# Patient Record
Sex: Female | Born: 1945 | Race: White | Hispanic: No
Health system: Southern US, Community
[De-identification: ages and names within clinical notes are randomized; demographics above are authoritative.]

---

## 2013-09-11 ENCOUNTER — Emergency Department (HOSPITAL_COMMUNITY)
Admission: EM | Admit: 2013-09-11 | Discharge: 2013-09-11 | Disposition: A | Discharge status: Home / Self Care | Payer: Medicaid Other | Source: Home / Self Care | Attending: Family Medicine | Admitting: Family Medicine

## 2013-09-11 ENCOUNTER — Encounter (HOSPITAL_COMMUNITY): Payer: Self-pay | Admitting: Emergency Medicine

## 2013-09-11 DIAGNOSIS — Z760 Encounter for issue of repeat prescription: Secondary | ICD-10-CM

## 2013-09-11 MED ORDER — CITALOPRAM HYDROBROMIDE 40 MG PO TABS: 40.0000 mg | ORAL_TABLET | Freq: Every day | ORAL | Status: AC

## 2013-09-11 MED ORDER — ATORVASTATIN CALCIUM 10 MG PO TABS: 20.0000 mg | ORAL_TABLET | Freq: Every day | ORAL | Status: AC

## 2013-09-11 MED ORDER — CELECOXIB 200 MG PO CAPS: 200.0000 mg | ORAL_CAPSULE | Freq: Two times a day (BID) | ORAL | Status: AC

## 2013-09-11 MED ORDER — TRIAMTERENE-HCTZ 37.5-25 MG PO TABS: 1.0000 | ORAL_TABLET | Freq: Every day | ORAL | Status: AC

## 2013-09-11 MED ORDER — METOPROLOL SUCCINATE ER 25 MG PO TB24: 25.0000 mg | ORAL_TABLET | Freq: Every day | ORAL | Status: AC

## 2013-09-11 MED ORDER — LISINOPRIL 40 MG PO TABS: 40.0000 mg | ORAL_TABLET | Freq: Every day | ORAL | Status: AC

## 2013-09-11 NOTE — ED Notes (Signed)
C/o medication refill States she just moved in town on Oct.15, 2014  She just was approved for [employer] she has been out of medication for 20 days now

## 2013-09-11 NOTE — ED Provider Notes (Signed)
CSN: [id number]     Arrival date & time 09/11/13  1438 History   First MD Initiated Contact with Patient 09/11/13 1544     Chief Complaint  Patient presents with  . Medication Refill   (Consider location/radiation/quality/duration/timing/severity/associated sxs/prior Treatment) HPI Comments: Patient states she has recently relocated to [facility] from [place] and has not yet established with a PCP. Was recently approved for Medicaid and is here to request refills prescriptions for her daily medications.   The history is provided by the patient.    History reviewed. No pertinent past medical history. No past surgical history on file. No family history on file. History  Substance Use Topics  . Smoking status: Not on file  . Smokeless tobacco: Not on file  . Alcohol Use: Not on file   OB History   Grav Para Term Preterm Abortions TAB SAB Ect Mult Living                 Review of Systems  All other systems reviewed and are negative.    Allergies  Codeine  Home Medications   Current Outpatient Rx  Name  Route  Sig  Dispense  Refill  . atorvastatin (LIPITOR) 20 MG tablet   Oral   Take 20 mg by mouth daily.         . celecoxib (CELEBREX) 200 MG capsule   Oral   Take 200 mg by mouth 2 (two) times daily.         Marland Kitchen lisinopril (PRINIVIL,ZESTRIL) 40 MG tablet   Oral   Take 40 mg by mouth daily.         . metoprolol succinate (TOPROL-XL) 25 MG 24 hr tablet   Oral   Take 25 mg by mouth daily.         Marland Kitchen oxyCODONE-acetaminophen (PERCOCET) 10-325 MG per tablet   Oral   Take 1 tablet by mouth every 4 (four) hours as needed for pain.         Marland Kitchen triamterene-hydrochlorothiazide (DYAZIDE) 37.5-25 MG per capsule   Oral   Take 1 capsule by mouth daily.         Marland Kitchen zolpidem (AMBIEN) 10 MG tablet   Oral   Take 10 mg by mouth at bedtime as needed for sleep.         Marland Kitchen atorvastatin (LIPITOR) 10 MG tablet   Oral   Take 2 tablets (20 mg total) by mouth daily.   30 tablet  1   . celecoxib (CELEBREX) 200 MG capsule   Oral   Take 1 capsule (200 mg total) by mouth 2 (two) times daily.   60 capsule   0   . citalopram (CELEXA) 40 MG tablet   Oral   Take 1 tablet (40 mg total) by mouth daily.   30 tablet   0   . lisinopril (PRINIVIL,ZESTRIL) 40 MG tablet   Oral   Take 1 tablet (40 mg total) by mouth daily.   30 tablet   1   . metoprolol succinate (TOPROL XL) 25 MG 24 hr tablet   Oral   Take 1 tablet (25 mg total) by mouth daily.   30 tablet   1   . triamterene-hydrochlorothiazide (MAXZIDE-25) 37.5-25 MG per tablet   Oral   Take 1 tablet by mouth daily.   30 tablet   1    BP 146/68  Pulse 72  Temp(Src) 98 F (36.7 C) (Oral)  Resp 18  SpO2 100% Physical Exam  Nursing  note and vitals reviewed. Constitutional: She is oriented to person, place, and time. She appears well-developed and well-nourished. No distress.  +obese  HENT:  Head: Normocephalic and atraumatic.  Eyes: Conjunctivae are normal.  Cardiovascular: Normal rate.   Pulmonary/Chest: Effort normal.  Abdominal: Soft. There is no tenderness.  Musculoskeletal: Normal range of motion.  Neurological: She is alert and oriented to person, place, and time.  Skin: Skin is warm and dry. No rash noted.  Psychiatric: She has a normal mood and affect. Her behavior is normal.    ED Course  Procedures (including critical care time) Labs Review Labs Reviewed - No data to display Imaging Review No results found.  EKG Interpretation    Date/Time:    Ventricular Rate:    PR Interval:    QRS Duration:   QT Interval:    QTC Calculation:   R Axis:     Text Interpretation:              MDM  Provided patient with community resources to locate PCP and limited prescriptions for necessary daily medications.    Jess Barters [place], [place] 09/11/13 1655

## 2013-09-12 NOTE — ED Provider Notes (Signed)
Medical screening examination/treatment/procedure(s) were performed by resident physician or non-physician practitioner and as supervising physician I was immediately available for consultation/collaboration.   Barkley Bruns MD.   Linna Hoff, MD 09/12/13 [phone]

## 2013-10-21 ENCOUNTER — Other Ambulatory Visit: Payer: Self-pay | Admitting: Cardiovascular Disease

## 2013-10-21 ENCOUNTER — Ambulatory Visit
Admission: RE | Admit: 2013-10-21 | Discharge: 2013-10-21 | Disposition: A | Discharge status: Home / Self Care | Payer: Medicaid Other | Source: Ambulatory Visit | Attending: Cardiovascular Disease | Admitting: Cardiovascular Disease

## 2013-10-21 DIAGNOSIS — M25569 Pain in unspecified knee: Secondary | ICD-10-CM

## 2013-10-21 DIAGNOSIS — J45909 Unspecified asthma, uncomplicated: Secondary | ICD-10-CM

## 2014-11-04 IMAGING — CR DG KNEE 1-2V*R*
3 series · 3 of 3 positions shown · non-contrast
Comparison: None.

CLINICAL DATA: Knee pain.

EXAM:
RIGHT KNEE - 1-2 VIEW

[t knee ap right]
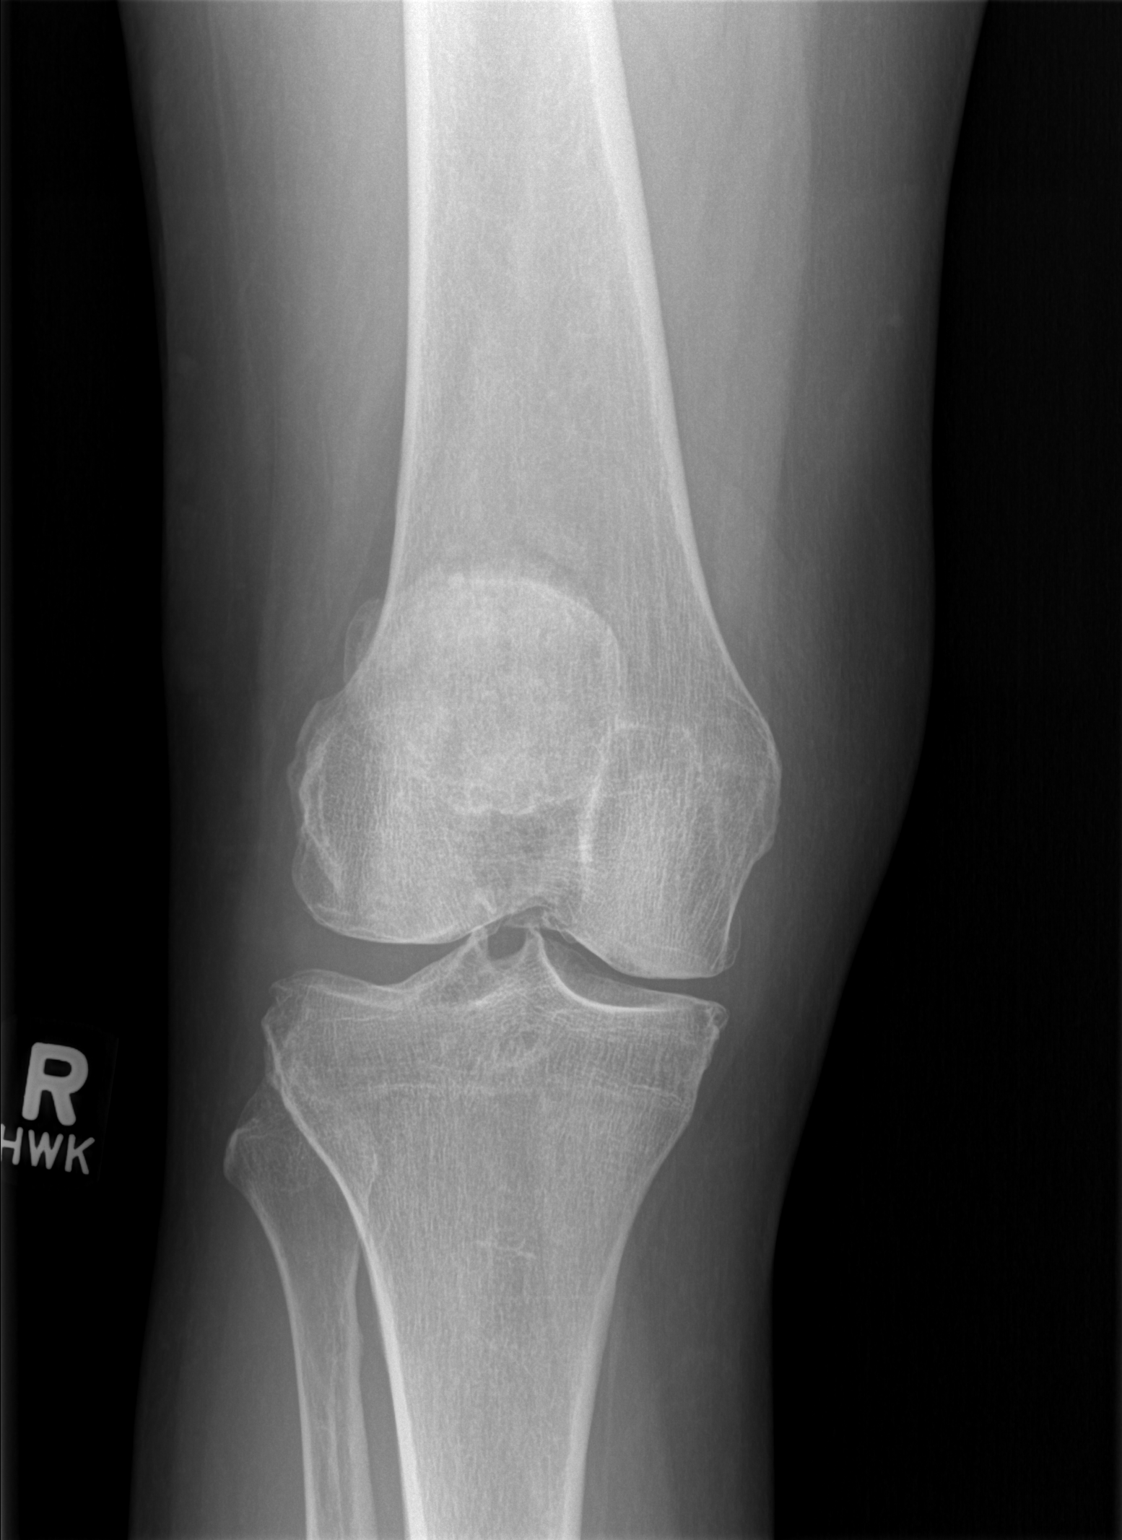

[t knee lat right (1 of 2)]
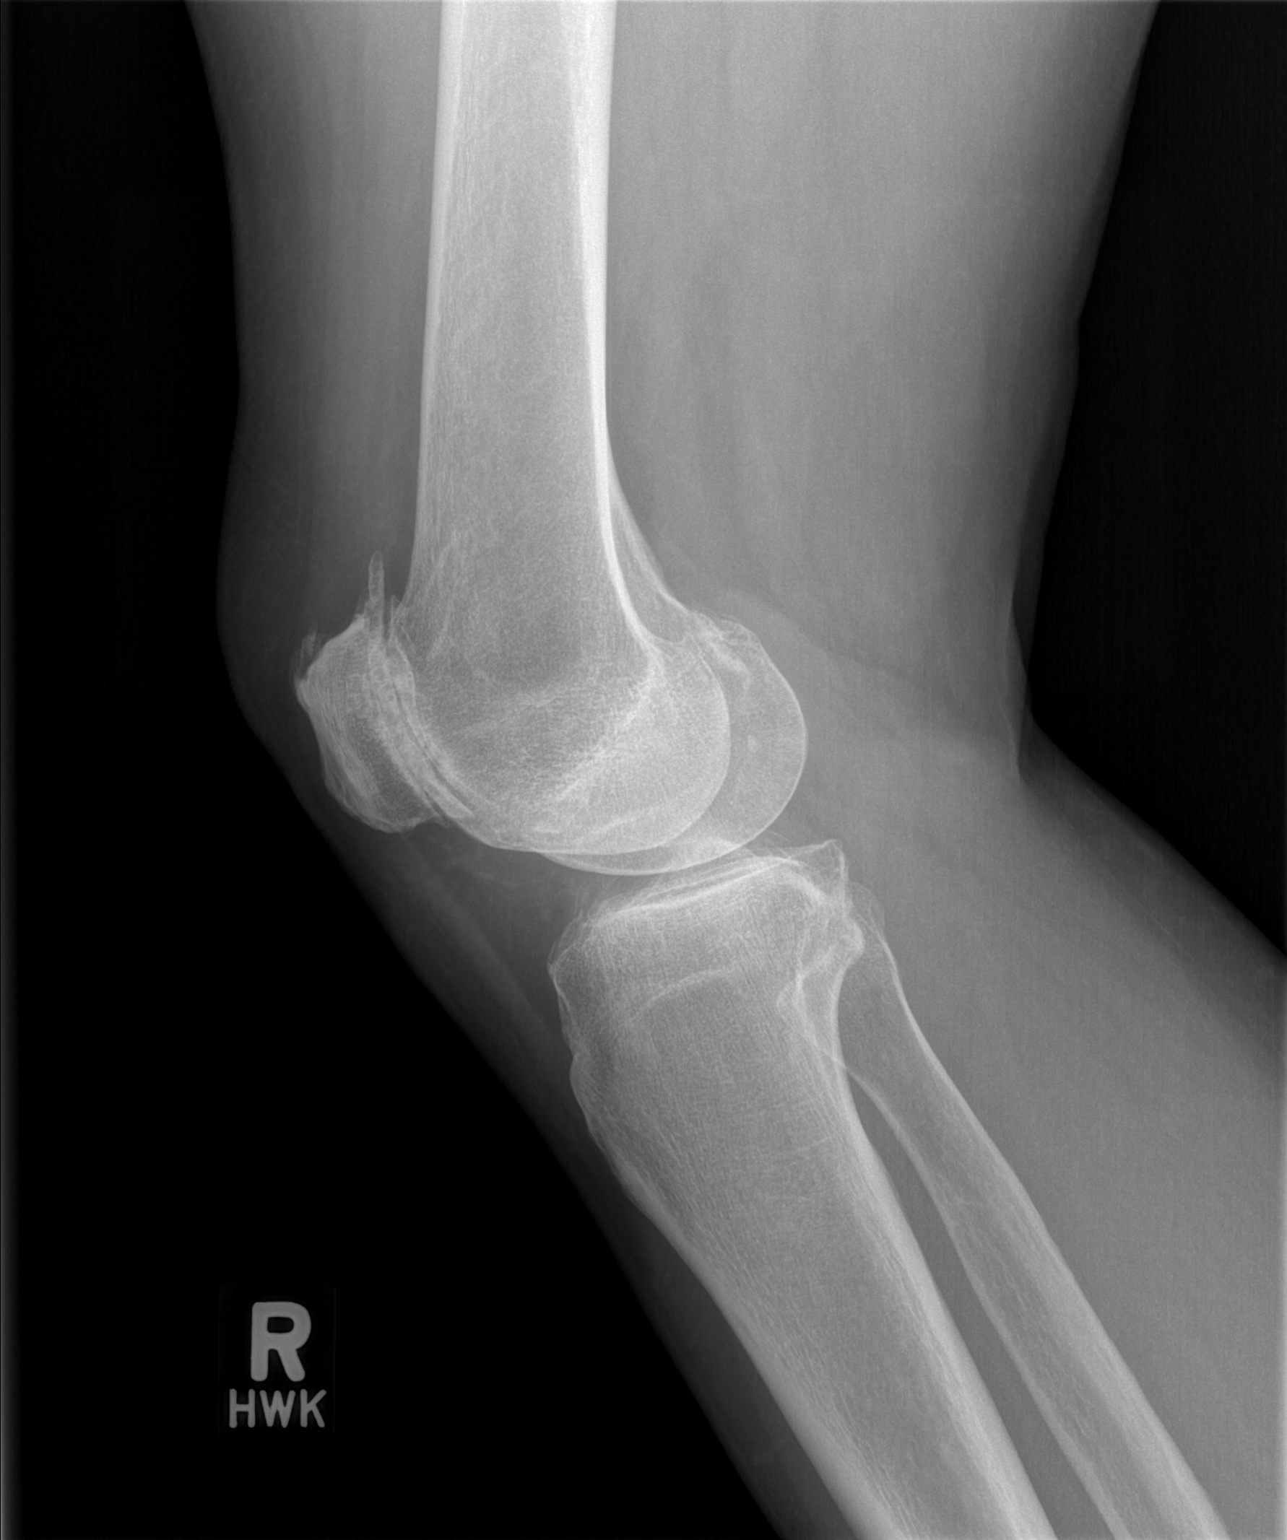

[t knee lat right (2 of 2)]
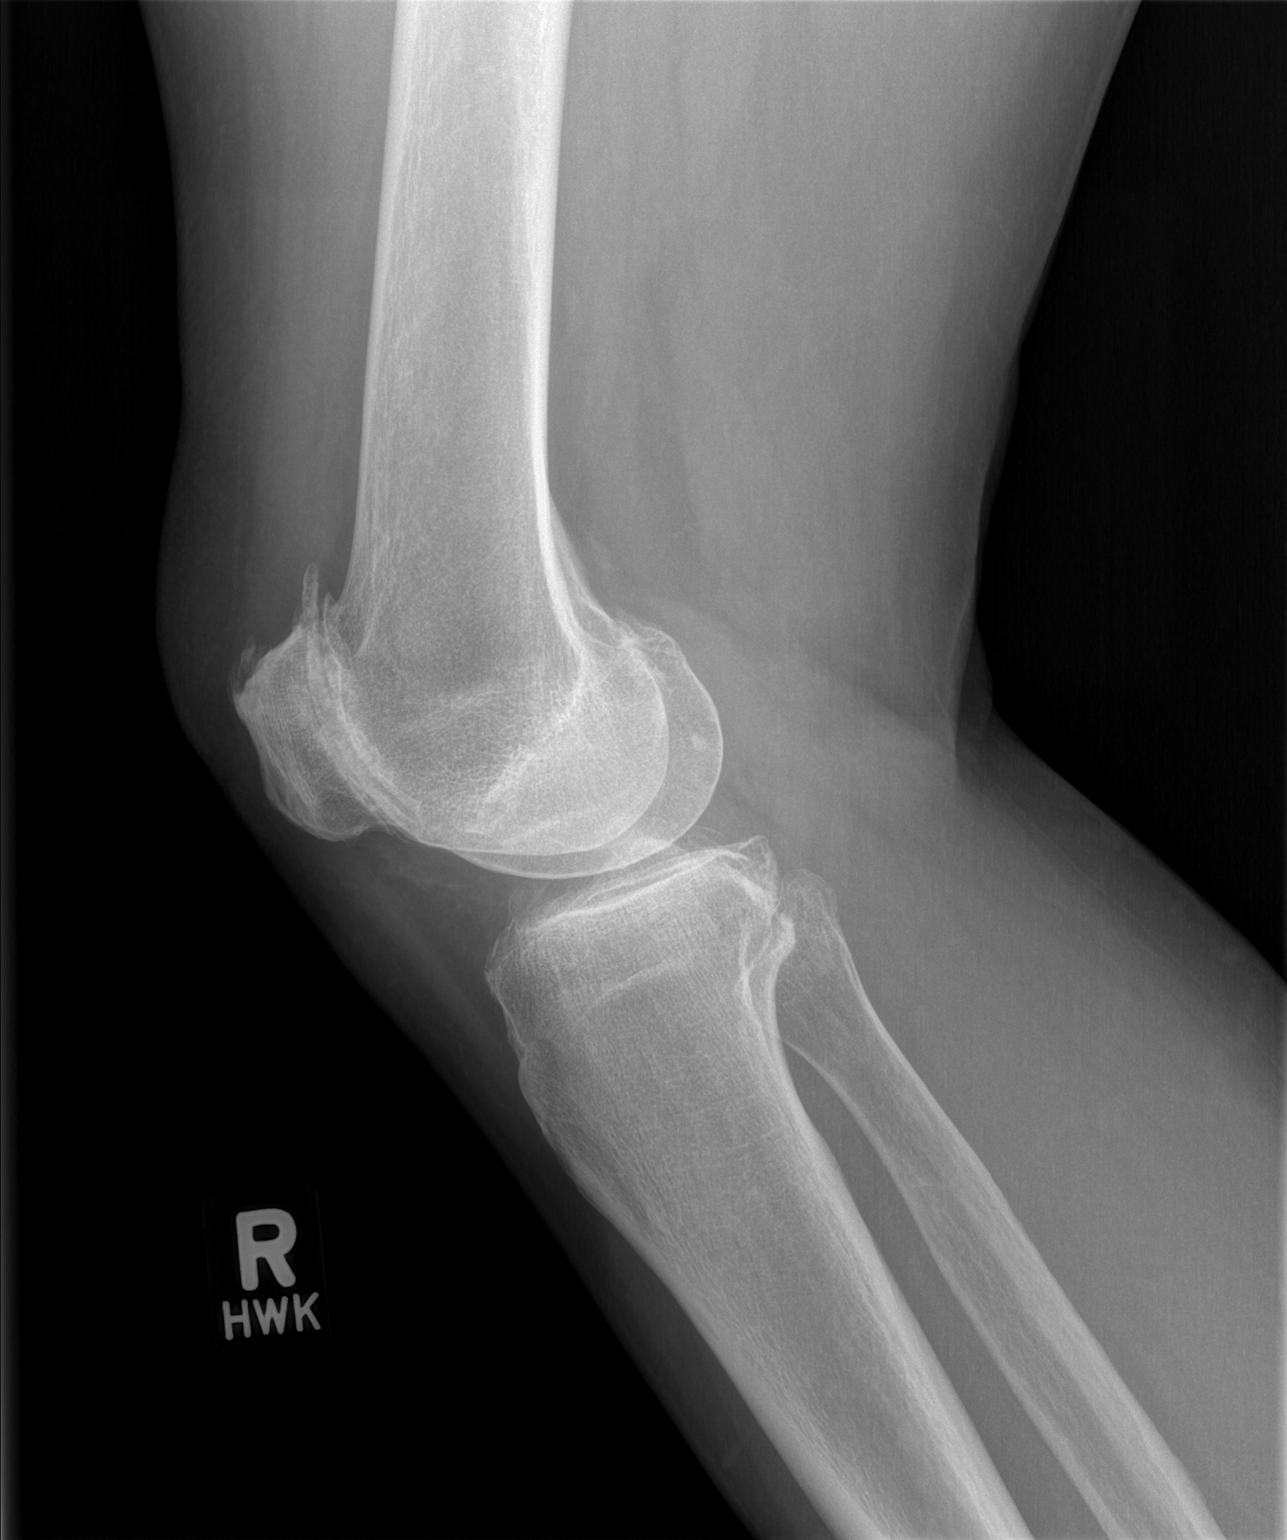

[3 of 3 positions shown; findings below may reference images not displayed]

FINDINGS: Joint space narrowing, subchondral sclerosis/cyst formation and
osteophytosis are seen in the patellofemoral compartment. There may
be minimal sclerosis in the medial compartment. Peaking of the
tibial spines. No definite joint effusion.
IMPRESSION: Osteoarthritis, worst in the patellofemoral compartment.
